# Patient Record
Sex: Male | Born: 1985 | Race: White | Hispanic: No | Marital: Married | Smoking: Former smoker
Health system: Southern US, Community
[De-identification: ages and names within clinical notes are randomized; demographics above are authoritative.]

## PROBLEM LIST (undated history)

## (undated) DIAGNOSIS — F419 Anxiety disorder, unspecified: Secondary | ICD-10-CM

---
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
## 2008-04-09 ENCOUNTER — Emergency Department: Payer: Self-pay | Admitting: Emergency Medicine

## 2010-01-22 ENCOUNTER — Emergency Department (HOSPITAL_COMMUNITY)
Admission: EM | Admit: 2010-01-22 | Discharge: 2010-01-22 | Payer: Self-pay | Source: Home / Self Care | Admitting: Emergency Medicine

## 2017-06-10 DIAGNOSIS — M5412 Radiculopathy, cervical region: Secondary | ICD-10-CM | POA: Diagnosis not present

## 2017-06-10 DIAGNOSIS — M5416 Radiculopathy, lumbar region: Secondary | ICD-10-CM | POA: Diagnosis not present

## 2017-06-15 ENCOUNTER — Other Ambulatory Visit: Payer: Self-pay

## 2017-06-15 ENCOUNTER — Encounter (HOSPITAL_COMMUNITY): Payer: Self-pay

## 2017-06-15 ENCOUNTER — Emergency Department (HOSPITAL_COMMUNITY)
Admission: EM | Admit: 2017-06-15 | Discharge: 2017-06-15 | Disposition: A | Discharge status: Home / Self Care | Payer: BLUE CROSS/BLUE SHIELD | Attending: Emergency Medicine | Admitting: Emergency Medicine

## 2017-06-15 ENCOUNTER — Emergency Department (HOSPITAL_COMMUNITY): Payer: BLUE CROSS/BLUE SHIELD

## 2017-06-15 DIAGNOSIS — Z87891 Personal history of nicotine dependence: Secondary | ICD-10-CM | POA: Diagnosis not present

## 2017-06-15 DIAGNOSIS — Z79899 Other long term (current) drug therapy: Secondary | ICD-10-CM | POA: Diagnosis not present

## 2017-06-15 DIAGNOSIS — J36 Peritonsillar abscess: Secondary | ICD-10-CM | POA: Diagnosis not present

## 2017-06-15 DIAGNOSIS — J029 Acute pharyngitis, unspecified: Secondary | ICD-10-CM | POA: Diagnosis not present

## 2017-06-15 DIAGNOSIS — R221 Localized swelling, mass and lump, neck: Secondary | ICD-10-CM | POA: Diagnosis not present

## 2017-06-15 DIAGNOSIS — J039 Acute tonsillitis, unspecified: Secondary | ICD-10-CM | POA: Diagnosis not present

## 2017-06-15 HISTORY — DX: Anxiety disorder, unspecified: F41.9

## 2017-06-15 LAB — CBC
HCT: 39.3 % (ref 39.0–52.0)
HEMOGLOBIN: 13.6 g/dL (ref 13.0–17.0)
MCH: 31.1 pg (ref 26.0–34.0)
MCHC: 34.6 g/dL (ref 30.0–36.0)
MCV: 89.9 fL (ref 78.0–100.0)
Platelets: 173 10*3/uL (ref 150–400)
RBC: 4.37 MIL/uL (ref 4.22–5.81)
RDW: 12.7 % (ref 11.5–15.5)
WBC: 6.8 10*3/uL (ref 4.0–10.5)

## 2017-06-15 LAB — BASIC METABOLIC PANEL
Anion gap: 9 (ref 5–15)
BUN: 9 mg/dL (ref 6–20)
CALCIUM: 9.2 mg/dL (ref 8.9–10.3)
CHLORIDE: 104 mmol/L (ref 101–111)
CO2: 27 mmol/L (ref 22–32)
CREATININE: 0.81 mg/dL (ref 0.61–1.24)
GFR calc Af Amer: >60 mL/min (ref >60)
GFR calc non Af Amer: >60 mL/min (ref >60)
Glucose, Bld: 100 mg/dL — ABNORMAL HIGH (ref 65–99)
Potassium: 4 mmol/L (ref 3.5–5.1)
SODIUM: 140 mmol/L (ref 135–145)

## 2017-06-15 MED ORDER — IOPAMIDOL (ISOVUE-300) INJECTION 61%
INTRAVENOUS | Status: AC
  Filled 2017-06-15: qty 100

## 2017-06-15 MED ORDER — DEXAMETHASONE SODIUM PHOSPHATE 10 MG/ML IJ SOLN
20.0000 mg | Freq: Once | INTRAMUSCULAR | Status: AC
  Administered 2017-06-15: 20 mg via INTRAVENOUS
  Filled 2017-06-15: qty 2

## 2017-06-15 MED ORDER — IOPAMIDOL (ISOVUE-300) INJECTION 61%
80.0000 mL | Freq: Once | INTRAVENOUS | Status: AC | PRN
  Administered 2017-06-15: 75 mL via INTRAVENOUS

## 2017-06-15 MED ORDER — AMOXICILLIN 500 MG PO CAPS: 500.0000 mg | ORAL_CAPSULE | Freq: Two times a day (BID) | ORAL | 0 refills | Status: AC

## 2017-06-15 MED ORDER — CLINDAMYCIN PHOSPHATE 600 MG/50ML IV SOLN
600.0000 mg | Freq: Once | INTRAVENOUS | Status: AC
  Administered 2017-06-15: 600 mg via INTRAVENOUS
  Filled 2017-06-15: qty 50

## 2017-06-15 NOTE — ED Triage Notes (Addendum)
Pt arrives today c/o right neck pain x1 week. Pt was seen at urgent care this morning--strep test negative, but was dx with peritonsillar abscess and instructed to be seen at the emergency department. Pt reports pain with swallowing, but is able to swallow. Swelling noted in right side lymph node area

## 2017-06-15 NOTE — Discharge Instructions (Addendum)
Take antibiotics as prescribed.  Take the entire course, even if your symptoms improve. Continue to use Tylenol or ibuprofen as needed for pain or swelling. Follow-up with the ear nose and throat doctor if your symptoms are worsening. Return to the emergency room if you develop difficulty breathing, inability to swallow your spit, or any new or concerning symptoms.

## 2017-06-15 NOTE — ED Provider Notes (Signed)
[facility]-EMERGENCY DEPT Provider Note   CSN: [id number] Arrival date & time: 06/15/17  1138     History   Chief Complaint Chief Complaint  Patient presents with  . Abscess    HPI Brian Archer is a 32 y.o. male presenting for evaluation of sore throat.  Patient states that he developed a sore throat on Monday or Tuesday.  Pain is on the right side.  It has progressively gotten more painful and difficult to swallow.  He was evaluated at urgent care, told to come to the ER for concerns for peritonsillar abscess.  He had a negative strep test performed at the urgent care.  He denies fevers, chills, ear pain, nasal congestion, chest pain, cough, shortness of breath, nausea, vomiting, abdominal pain.  He denies medical problems, takes fluoxetine daily.  No other medications.  He has been taking ibuprofen with mild improvement of pain and swelling.  He is handling secretions easily.  He reports his voice sounds more deep/muffled.  No trismus.  Girlfriend has sore throat, no one else sick.  HPI  Past Medical History:  Diagnosis Date  . Anxiety     There are no active problems to display for this patient.   History reviewed. No pertinent surgical history.      Home Medications    Prior to Admission medications   Medication Sig Start Date End Date Taking? Authorizing Provider  FLUoxetine (PROZAC) 40 MG capsule Take 40 mg by mouth daily. 06/09/17  Yes [provider]  ibuprofen (ADVIL,MOTRIN) 200 MG tablet Take 800 mg by mouth every 8 (eight) hours as needed for mild pain.   Yes [provider]  amoxicillin (AMOXIL) 500 MG capsule Take 1 capsule (500 mg total) by mouth 2 (two) times daily for 10 days. 06/15/17 06/25/17  Abrahim Sargent, PA-C    Family History History reviewed. No pertinent family history.  Social History Social History   Tobacco Use  . Smoking status: Former Games developermoker  . Smokeless tobacco: Never Used  Substance Use  Topics  . Alcohol use: Never    Frequency: Never  . Drug use: Never     Allergies   Patient has no known allergies.   Review of Systems Review of Systems  Constitutional: Negative for fever.  HENT: Positive for sore throat, trouble swallowing and voice change. Negative for ear pain and rhinorrhea.   Respiratory: Negative for cough and shortness of breath.   Cardiovascular: Negative for chest pain.  Gastrointestinal: Negative for nausea and vomiting.  All other systems reviewed and are negative.    Physical Exam Updated Vital Signs BP 120/75 (BP Location: Right Arm)   Pulse (!) 58   Temp 98 F (36.7 C) (Oral)   Resp 18   Ht 6\' 2"  (1.88 m)   Wt 94.8 kg (209 lb)   SpO2 98%   BMI 26.83 kg/m   Physical Exam  Constitutional: He is oriented to person, place, and time. He appears well-developed and well-nourished. No distress.  In no acute distress  HENT:  Head: Normocephalic and atraumatic.  Right Ear: Tympanic membrane, external ear and ear canal normal.  Left Ear: Tympanic membrane, external ear and ear canal normal.  Nose: Nose normal. Right sinus exhibits no maxillary sinus tenderness and no frontal sinus tenderness. Left sinus exhibits no maxillary sinus tenderness and no frontal sinus tenderness.  Mouth/Throat: Uvula is midline and mucous membranes are normal. No trismus in the jaw. Posterior oropharyngeal erythema and tonsillar abscesses present.  Tonsils are 3+ on the right. Tonsillar exudate.  Right tonsil swollen, erythematous, and with exudate.  Pushing uvula to the left.  No trismus.  Handling secretions easily.  Muffled voice.  Eyes: Pupils are equal, round, and reactive to light. Conjunctivae and EOM are normal.  Neck: Normal range of motion.  Cardiovascular: Normal rate, regular rhythm and intact distal pulses.  Pulmonary/Chest: Effort normal and breath sounds normal. He has no decreased breath sounds. He has no wheezes. He has no rhonchi. He has no rales.  Pt  speaking in full sentences without difficulty. Clear lung sounds in all fields  Abdominal: Soft. He exhibits no distension. There is no tenderness.  Musculoskeletal: Normal range of motion.  Lymphadenopathy:    He has no cervical adenopathy.  Neurological: He is alert and oriented to person, place, and time.  Skin: Skin is warm.  Psychiatric: He has a normal mood and affect.  Nursing note and vitals reviewed.    ED Treatments / Results  Labs (all labs ordered are listed, but only abnormal results are displayed) Labs Reviewed  BASIC METABOLIC PANEL - Abnormal; Notable for the following components:      Result Value   Glucose, Bld 100 (*)    All other components within normal limits  CBC    EKG None  Radiology Ct Soft Tissue Neck W Contrast  Result Date: 06/15/2017 CLINICAL DATA:  Right-sided neck pain for 1 week.  Stridor. EXAM: CT NECK WITH CONTRAST TECHNIQUE: Multidetector CT imaging of the neck was performed using the standard protocol following the bolus administration of intravenous contrast. CONTRAST:  75mL ISOVUE-300 IOPAMIDOL (ISOVUE-300) INJECTION 61% COMPARISON:  Cervical spine radiographs 01/22/2010 FINDINGS: Pharynx and larynx: The right palatine tonsil is enlarged and heterogeneous. A discrete abscess is not present. There is diffuse stranding in the adjacent parapharyngeal fat. No focal mass lesion is present. The airway is patent. There is mild prominence of the left palatine tonsil and lingual tonsils. No focal mass lesion is present. The hypopharynx is within normal limits. Epiglottis is normal. Vocal cords are midline and symmetric. The trachea is within normal limits. Salivary glands: The parotid and submandibular glands are normal bilaterally. Thyroid: Choose 1 Lymph nodes: Prominent right bilateral enlarged level 2 lymph nodes are than left more. There stranding about the nodes on the right compatible with acute inflammation. Vascular: Negative Limited intracranial:  Within normal limits. Visualized orbits: Visualized globes and orbits are unremarkable. Mastoids and visualized paranasal sinuses: A polyp or mucous retention cyst is noted anteriorly in the right sphenoid sinus. The paranasal sinuses and mastoid air cells are otherwise clear. Skeleton: Vertebral body heights and alignment are maintained. No focal lytic or blastic lesions are present. Upper chest: The lung apices are clear.  Thoracic inlet is normal. IMPRESSION: 1. Enlarged heterogeneously enhancing right palatine tonsil compatible with acute tonsillitis/pharyngitis. 2. No discrete drainable abscess. 3. Reactive level 2 adenopathy, right greater than left. Electronically Signed   By: Marin Roberts M.D.   On: 06/15/2017 15:17    Procedures Procedures (including critical care time)  Medications Ordered in ED Medications  iopamidol (ISOVUE-300) 61 % injection (has no administration in time range)  dexamethasone (DECADRON) injection 20 mg (20 mg Intravenous Given 06/15/17 1428)  clindamycin (CLEOCIN) IVPB 600 mg (0 mg Intravenous Stopped 06/15/17 1520)  iopamidol (ISOVUE-300) 61 % injection 80 mL (75 mLs Intravenous Contrast Given 06/15/17 1443)     Initial Impression / Assessment and Plan / ED Course  I have reviewed the triage  vital signs and the nursing notes.  Pertinent labs & imaging results that were available during my care of the patient were reviewed by me and considered in my medical decision making (see chart for details).     Patient presenting for evaluation of sore throat.  Physical exam concerning for peritonsillar abscess.  Handling secretions without difficulty, no airway compromise at this time.  Had negative strep test at urgent care.  Will obtain basic labs, ct neck, and give Decadron and antibiotics.  Labs reassuring, no leukocytosis.  CT neck shows acute tonsillitis/pharyngitis without deep space infection.  Patient reports mild improvement with antibiotics and Decadron.   Will discharge with Amoxil and have patient follow-up with ENT as needed.  At this time, patient is a for discharge.  Strict return precautions given.  Patient states he understands and agrees to plan.  Final Clinical Impressions(s) / ED Diagnoses   Final diagnoses:  Tonsillitis    ED Discharge Orders        Ordered    amoxicillin (AMOXIL) 500 MG capsule  2 times daily     06/15/17 [address] PA-C 06/15/17 1605    Azalia Bilis, MD 06/16/17 2258

## 2017-07-12 DIAGNOSIS — N342 Other urethritis: Secondary | ICD-10-CM | POA: Diagnosis not present

## 2018-05-19 DIAGNOSIS — D485 Neoplasm of uncertain behavior of skin: Secondary | ICD-10-CM | POA: Diagnosis not present

## 2018-05-19 DIAGNOSIS — D2261 Melanocytic nevi of right upper limb, including shoulder: Secondary | ICD-10-CM | POA: Diagnosis not present

## 2018-05-20 DIAGNOSIS — Z13220 Encounter for screening for lipoid disorders: Secondary | ICD-10-CM | POA: Diagnosis not present

## 2018-05-20 DIAGNOSIS — Z0000 Encounter for general adult medical examination without abnormal findings: Secondary | ICD-10-CM | POA: Diagnosis not present

## 2018-10-25 ENCOUNTER — Emergency Department (HOSPITAL_COMMUNITY)
Admission: EM | Admit: 2018-10-25 | Discharge: 2018-10-25 | Disposition: A | Discharge status: Home / Self Care | Payer: BC Managed Care – PPO | Attending: Emergency Medicine | Admitting: Emergency Medicine

## 2018-10-25 ENCOUNTER — Other Ambulatory Visit: Payer: Self-pay

## 2018-10-25 ENCOUNTER — Encounter (HOSPITAL_COMMUNITY): Payer: Self-pay | Admitting: Emergency Medicine

## 2018-10-25 DIAGNOSIS — Z79899 Other long term (current) drug therapy: Secondary | ICD-10-CM | POA: Diagnosis not present

## 2018-10-25 DIAGNOSIS — Z87891 Personal history of nicotine dependence: Secondary | ICD-10-CM | POA: Insufficient documentation

## 2018-10-25 DIAGNOSIS — L0201 Cutaneous abscess of face: Secondary | ICD-10-CM | POA: Diagnosis not present

## 2018-10-25 DIAGNOSIS — Z23 Encounter for immunization: Secondary | ICD-10-CM | POA: Insufficient documentation

## 2018-10-25 MED ORDER — TETANUS-DIPHTH-ACELL PERTUSSIS 5-2.5-18.5 LF-MCG/0.5 IM SUSP
0.5000 mL | Freq: Once | INTRAMUSCULAR | Status: AC
  Administered 2018-10-25: 09:00:00 0.5 mL via INTRAMUSCULAR
  Filled 2018-10-25: qty 0.5

## 2018-10-25 MED ORDER — DOXYCYCLINE HYCLATE 100 MG PO CAPS: 100.0000 mg | ORAL_CAPSULE | Freq: Two times a day (BID) | ORAL | 0 refills | Status: DC

## 2018-10-25 MED ORDER — LIDOCAINE-EPINEPHRINE 2 %-1:100000 IJ SOLN
10.0000 mL | Freq: Once | INTRAMUSCULAR | Status: AC
  Administered 2018-10-25: 10 mL via INTRADERMAL
  Filled 2018-10-25: qty 1

## 2018-10-25 MED ORDER — IBUPROFEN 600 MG PO TABS: 600.0000 mg | ORAL_TABLET | Freq: Four times a day (QID) | ORAL | 0 refills | Status: DC | PRN

## 2018-10-25 NOTE — ED Triage Notes (Signed)
Patient here from home with complaints of abscess to left side of face under eye since Wednesday. Denies fever.

## 2018-10-25 NOTE — ED Provider Notes (Signed)
[name] Provider Note   CSN: [id number] Arrival date & time: 10/25/18  4098     History   Chief Complaint Chief Complaint  Patient presents with  . Abscess    HPI Brian Archer is a 33 y.o. male.     The history is provided by the patient. No language interpreter was used.  Abscess Associated symptoms: no fever and no headaches      33 year old male presents for evaluation of facial abscess.  Patient report for the past 4 days he noticed gradual onset of a bump to the left side of his face that has become increasingly more painful and increased in size.  Described pain as a throbbing sensation, moderate in severity, nonradiating.  No pain with eye movement, no fever chills and denies any injury.  He reported having abscess in his armpits in the past but none in the face.  He has been using warm compress without adequate relief.  He is unable to recall last tetanus status.  Past Medical History:  Diagnosis Date  . Anxiety     There are no active problems to display for this patient.   History reviewed. No pertinent surgical history.      Home Medications    Prior to Admission medications   Medication Sig Start Date End Date Taking? Authorizing Provider  FLUoxetine (PROZAC) 40 MG capsule Take 40 mg by mouth daily. 06/09/17   [provider]  ibuprofen (ADVIL,MOTRIN) 200 MG tablet Take 800 mg by mouth every 8 (eight) hours as needed for mild pain.    [provider]    Family History No family history on file.  Social History Social History   Tobacco Use  . Smoking status: Former Research scientist (life sciences)  . Smokeless tobacco: Never Used  Substance Use Topics  . Alcohol use: Never    Frequency: Never  . Drug use: Never     Allergies   Patient has no known allergies.   Review of Systems Review of Systems  Constitutional: Negative for fever.  Skin: Positive for rash.  Neurological: Negative for headaches.      Physical Exam Updated Vital Signs BP (!) 155/79 (BP Location: Right Arm)   Pulse 71   Temp 98.1 F (36.7 C) (Oral)   Resp 19   SpO2 100%   Physical Exam Vitals signs and nursing note reviewed.  Constitutional:      General: He is not in acute distress.    Appearance: He is well-developed.  HENT:     Head: Atraumatic.  Eyes:     Extraocular Movements: Extraocular movements intact.     Conjunctiva/sclera: Conjunctivae normal.     Pupils: Pupils are equal, round, and reactive to light.  Neck:     Musculoskeletal: Neck supple.  Skin:    Findings: No rash.     Comments: Face: An area of induration with small amount of fluctuant approximately 1 cm in diameter with surrounding skin erythema and edema noted to left zygomatic arch without any orbital involvement.  Area is tender to palpation.  Neurological:     Mental Status: He is alert.      ED Treatments / Results  Labs (all labs ordered are listed, but only abnormal results are displayed) Labs Reviewed - No data to display  EKG None  Radiology No results found.  Procedures .Marland KitchenIncision and Drainage  Date/Time: 10/25/2018 9:42 AM Performed by: Domenic Moras, PA-C Authorized by: Domenic Moras, PA-C   Consent:  Consent obtained:  Verbal   Consent given by:  Patient   Risks discussed:  Bleeding, incomplete drainage, pain and damage to other organs   Alternatives discussed:  No treatment Universal protocol:    Procedure explained and questions answered to patient or proxy's satisfaction: yes     Relevant documents present and verified: yes     Test results available and properly labeled: yes     Imaging studies available: yes     Required blood products, implants, devices, and special equipment available: yes     Site/side marked: yes     Immediately prior to procedure a time out was called: yes     Patient identity confirmed:  Verbally with patient Location:    Type:  Abscess   Size:  1cm   Location:  Head    Head location:  Face Pre-procedure details:    Skin preparation:  Betadine Anesthesia (see MAR for exact dosages):    Anesthesia method:  Local infiltration   Local anesthetic:  Lidocaine 1% WITH epi Procedure type:    Complexity:  Simple Procedure details:    Incision types:  Single straight   Incision depth:  Subcutaneous   Scalpel blade:  11   Wound management:  Probed and deloculated, irrigated with saline and extensive cleaning   Drainage:  Purulent   Drainage amount:  Scant   Packing materials:  None Post-procedure details:    Patient tolerance of procedure:  Tolerated well, no immediate complications   (including critical care time)  Medications Ordered in ED Medications  Tdap (BOOSTRIX) injection 0.5 mL (0.5 mLs Intramuscular Given 10/25/18 0919)  lidocaine-EPINEPHrine (XYLOCAINE W/EPI) 2 %-1:100000 (with pres) injection 10 mL (10 mLs Intradermal Given 10/25/18 0920)     Initial Impression / Assessment and Plan / ED Course  I have reviewed the triage vital signs and the nursing notes.  Pertinent labs & imaging results that were available during my care of the patient were reviewed by me and considered in my medical decision making (see chart for details).        BP (!) 155/79 (BP Location: Right Arm)   Pulse 71   Temp 98.1 F (36.7 C) (Oral)   Resp 19   SpO2 100%    Final Clinical Impressions(s) / ED Diagnoses   Final diagnoses:  Cutaneous abscess of face    ED Discharge Orders         Ordered    doxycycline (VIBRAMYCIN) 100 MG capsule  2 times daily     10/25/18 0945    ibuprofen (ADVIL) 600 MG tablet  Every 6 hours PRN     10/25/18 0945         9:07 AM Patient here with a cutaneous abscess noted to the left side of his face near his zygomatic arch.  Will perform incision and drainage.  Will update tetanus.  9:43 AM Successful I&D of facial abscess.  Patient discharged home with antibiotic and anti-inflammatory medication.  Recommend warm  compress.  Return precautions discussed.   Fayrene Helper, PA-C 10/25/18 3295    Linwood Dibbles, MD 10/26/18 1003

## 2019-02-19 DIAGNOSIS — E785 Hyperlipidemia, unspecified: Secondary | ICD-10-CM | POA: Diagnosis not present

## 2019-02-20 ENCOUNTER — Emergency Department (HOSPITAL_COMMUNITY): Payer: BC Managed Care – PPO

## 2019-02-20 ENCOUNTER — Emergency Department (HOSPITAL_COMMUNITY)
Admission: EM | Admit: 2019-02-20 | Discharge: 2019-02-20 | Disposition: A | Discharge status: Home / Self Care | Payer: BC Managed Care – PPO | Attending: Emergency Medicine | Admitting: Emergency Medicine

## 2019-02-20 ENCOUNTER — Encounter (HOSPITAL_COMMUNITY): Payer: Self-pay

## 2019-02-20 ENCOUNTER — Other Ambulatory Visit: Payer: Self-pay

## 2019-02-20 DIAGNOSIS — Z79899 Other long term (current) drug therapy: Secondary | ICD-10-CM | POA: Diagnosis not present

## 2019-02-20 DIAGNOSIS — R0789 Other chest pain: Secondary | ICD-10-CM | POA: Diagnosis not present

## 2019-02-20 DIAGNOSIS — J189 Pneumonia, unspecified organism: Secondary | ICD-10-CM | POA: Insufficient documentation

## 2019-02-20 DIAGNOSIS — R109 Unspecified abdominal pain: Secondary | ICD-10-CM | POA: Diagnosis not present

## 2019-02-20 DIAGNOSIS — R079 Chest pain, unspecified: Secondary | ICD-10-CM | POA: Diagnosis not present

## 2019-02-20 DIAGNOSIS — M546 Pain in thoracic spine: Secondary | ICD-10-CM | POA: Insufficient documentation

## 2019-02-20 LAB — BASIC METABOLIC PANEL
Anion gap: 13 (ref 5–15)
BUN: 8 mg/dL (ref 6–20)
CO2: 25 mmol/L (ref 22–32)
Calcium: 9.6 mg/dL (ref 8.9–10.3)
Chloride: 101 mmol/L (ref 98–111)
Creatinine, Ser: 0.75 mg/dL (ref 0.61–1.24)
GFR calc Af Amer: >60 mL/min (ref >60)
GFR calc non Af Amer: >60 mL/min (ref >60)
Glucose, Bld: 131 mg/dL — ABNORMAL HIGH (ref 70–99)
Potassium: 3.7 mmol/L (ref 3.5–5.1)
Sodium: 139 mmol/L (ref 135–145)

## 2019-02-20 LAB — CBC
HCT: 46.1 % (ref 39.0–52.0)
Hemoglobin: 16.2 g/dL (ref 13.0–17.0)
MCH: 31.6 pg (ref 26.0–34.0)
MCHC: 35.1 g/dL (ref 30.0–36.0)
MCV: 90 fL (ref 80.0–100.0)
Platelets: 253 10*3/uL (ref 150–400)
RBC: 5.12 MIL/uL (ref 4.22–5.81)
RDW: 12.3 % (ref 11.5–15.5)
WBC: 5.5 10*3/uL (ref 4.0–10.5)
nRBC: 0 % (ref 0.0–0.2)

## 2019-02-20 LAB — TROPONIN I (HIGH SENSITIVITY)
Troponin I (High Sensitivity): 3 ng/L (ref <18)
Troponin I (High Sensitivity): 4 ng/L (ref <18)

## 2019-02-20 MED ORDER — IOHEXOL 350 MG/ML SOLN
100.0000 mL | Freq: Once | INTRAVENOUS | Status: AC | PRN
  Administered 2019-02-20: 100 mL via INTRAVENOUS

## 2019-02-20 MED ORDER — AZITHROMYCIN 250 MG PO TABS: 250.0000 mg | ORAL_TABLET | Freq: Every day | ORAL | 0 refills | Status: DC

## 2019-02-20 MED ORDER — SODIUM CHLORIDE 0.9% FLUSH: 3.0000 mL | Freq: Once | INTRAVENOUS | Status: DC

## 2019-02-20 MED ORDER — SODIUM CHLORIDE (PF) 0.9 % IJ SOLN
INTRAMUSCULAR | Status: AC
  Filled 2019-02-20: qty 50

## 2019-02-20 NOTE — ED Notes (Addendum)
Patient given discharge teaching and verbalized understanding. Patient ambulated out of ED with a steady gait. Patient VS not updated with 1 hour range. Patient wanted to be discharged.

## 2019-02-20 NOTE — ED Provider Notes (Signed)
[facility] DEPT Provider Note   CSN: [id number] Arrival date & time: 02/20/19  2683     History Chief Complaint  Patient presents with  . Back Pain    Brian Archer is a 34 y.o. male.  HPI 34 year old male presents with back pain and arm numbness.  He states that he has had the back pain pretty much constantly for the last couple days.  He rates it as a moderate, 5/10. It is in between his shoulder blades, may be a little bit more on the right.  Sometimes certain movements like bending make it a little worse.  Occasionally some chest tightness.  Feels like the pain radiates from his back to his chest.  Today at around 7:30 AM while he was walking his dog he felt both of his arms go numb like they were asleep.  He can move them but it felt heavier.  Lasted only a few minutes.  Currently they are much better.  No leg symptoms.  No abdominal pain.  Pain in his back is moderate.  Went to his doctor yesterday where he was told his blood pressure was a little high but otherwise he does not have hypertension.  He does have elevated cholesterol and has been trying diet changes.  Previous smoker but has quit for 3 years.  No family history of coronary disease.   Past Medical History:  Diagnosis Date  . Anxiety     There are no problems to display for this patient.   History reviewed. No pertinent surgical history.     Family History  Problem Relation Age of Onset  . Healthy Mother   . Healthy Father     Social History   Tobacco Use  . Smoking status: Former Research scientist (life sciences)  . Smokeless tobacco: Never Used  Substance Use Topics  . Alcohol use: Never  . Drug use: Never    Home Medications Prior to Admission medications   Medication Sig Start Date End Date Taking? Authorizing Provider  acetaminophen (TYLENOL) 325 MG tablet Take 650 mg by mouth every 6 (six) hours as needed for mild pain or headache.   Yes [provider]  FLUoxetine (PROZAC)  40 MG capsule Take 40 mg by mouth daily. 06/09/17  Yes [provider]  azithromycin (ZITHROMAX) 250 MG tablet Take 1 tablet (250 mg total) by mouth daily. Take first 2 tablets together, then 1 every day until finished. 02/20/19   Sherwood Gambler, MD    Allergies    Patient has no known allergies.  Review of Systems   Review of Systems  Constitutional: Negative for fever.  Respiratory: Negative for shortness of breath.   Cardiovascular: Positive for chest pain.  Gastrointestinal: Negative for abdominal pain.  Musculoskeletal: Positive for back pain.  Neurological: Positive for numbness.  All other systems reviewed and are negative.   Physical Exam Updated Vital Signs BP (!) 173/87   Pulse 66   Temp 98.2 F (36.8 C) (Oral)   Resp 16   Ht 6\' 2"  (1.88 m)   Wt 94.3 kg   SpO2 96%   BMI 26.71 kg/m   Physical Exam Vitals and nursing note reviewed.  Constitutional:      Appearance: He is well-developed.  HENT:     Head: Normocephalic and atraumatic.     Right Ear: External ear normal.     Left Ear: External ear normal.     Nose: Nose normal.  Eyes:     General:  Right eye: No discharge.        Left eye: No discharge.  Cardiovascular:     Rate and Rhythm: Normal rate and regular rhythm.     Pulses:          Radial pulses are 2+ on the right side and 2+ on the left side.     Heart sounds: Normal heart sounds.  Pulmonary:     Effort: Pulmonary effort is normal.     Breath sounds: Normal breath sounds.  Chest:     Chest wall: No tenderness.  Abdominal:     Palpations: Abdomen is soft.     Tenderness: There is no abdominal tenderness.  Musculoskeletal:     Cervical back: Neck supple. No tenderness.     Thoracic back: No tenderness.  Skin:    General: Skin is warm and dry.  Neurological:     Mental Status: He is alert.     Comments: 5/5 strength in all 4 extremities. Normal gross sensation.  Psychiatric:        Mood and Affect: Mood is not anxious.      ED Results / Procedures / Treatments   Labs (all labs ordered are listed, but only abnormal results are displayed) Labs Reviewed  BASIC METABOLIC PANEL - Abnormal; Notable for the following components:      Result Value   Glucose, Bld 131 (*)    All other components within normal limits  CBC  TROPONIN I (HIGH SENSITIVITY)  TROPONIN I (HIGH SENSITIVITY)    EKG EKG Interpretation  Date/Time:  Friday February 20 2019 08:51:07 EST Ventricular Rate:  70 PR Interval:    QRS Duration: 111 QT Interval:  403 QTC Calculation: 435 R Axis:   110 Text Interpretation: Sinus rhythm Borderline T abnormalities, inferior leads ST elevation likely early repol Confirmed by Pricilla Loveless [phone]) on 02/20/2019 9:03:37 AM   Radiology DG Chest 2 View  Result Date: 02/20/2019 CLINICAL DATA:  Chest/upper back pain EXAM: CHEST - 2 VIEW COMPARISON:  None. FINDINGS: Lungs are clear. Calcified granuloma at the medial right lung base. No pleural effusion or pneumothorax. The heart is normal in size. Visualized osseous structures are within normal limits. IMPRESSION: Normal chest radiographs. Electronically Signed   By: Charline Bills M.D.   On: 02/20/2019 09:28   CT Angio Chest/Abd/Pel for Dissection W and/or Wo Contrast  Result Date: 02/20/2019 CLINICAL DATA:  Chest and abdominal pain with radiation to back EXAM: CT ANGIOGRAPHY CHEST, ABDOMEN AND PELVIS TECHNIQUE: Initially, axial CT images were obtained through the chest without intravenous contrast material administration. Multidetector CT imaging through the chest, abdomen and pelvis was performed using the standard protocol during bolus administration of intravenous contrast. Multiplanar reconstructed images and MIPs were obtained and reviewed to evaluate the vascular anatomy. CONTRAST:  OMNIPAQUE IOHEXOL 350 MG/ML SOLN COMPARISON:  None. FINDINGS: CTA CHEST FINDINGS Cardiovascular: No intramural hematoma is evident on noncontrast enhanced  study. There is no thoracic aortic aneurysm or dissection. There is no evident mediastinal hematoma. Visualized great vessels appear normal. There is no appreciable atherosclerotic plaque noted in the arterial vessels in the thoracic region. There is no pericardial effusion or pericardial thickening. There is no demonstrable pulmonary embolus. Mediastinum/Nodes: Visualized thyroid appears normal. There is no thoracic adenopathy. No esophageal lesions are evident. Lungs/Pleura: No evident pneumothorax. There is a small area of ill-defined ground-glass type opacity in the superior segment of the left lower lobe. The lungs elsewhere are clear. No pleural effusions are evident.  Musculoskeletal: No fracture or dislocation. No blastic or lytic bone lesions. No evident chest wall lesion. Review of the MIP images confirms the above findings. CTA ABDOMEN AND PELVIS FINDINGS VASCULAR Aorta: There is no abdominal aortic aneurysm or dissection. No appreciable atherosclerotic plaque noted in the aorta. There is slight aortic tortuosity in the mid aorta. Celiac: Celiac artery and its branches are widely patent. No aneurysm or dissection. SMA: Superior mesenteric artery and its branches are widely patent. No aneurysm or dissection evident. Renals: There is a single renal artery on each side. Right renal artery branches in its midportion. Each renal artery its branches is widely patent. No appreciable atherosclerotic plaque. No aneurysm or dissection. No evident fibromuscular dysplasia. IMA: Inferior mesenteric artery its branches are widely patent. No aneurysm or dissection involving these vessels. Inflow: Pelvic arterial vessels appear widely patent throughout their respective courses. No aneurysm or dissection involving these vessels. The proximal profunda femoral and superficial femoral arteries are widely patent without aneurysm or dissection. Veins: No obvious venous abnormality within the limitations of this arterial phase  study. Review of the MIP images confirms the above findings. NON-VASCULAR Hepatobiliary: There is a degree of hepatic steatosis. No focal liver lesions are evident. Gallbladder wall is not appreciably thickened. There is no biliary duct dilatation. Pancreas: There is no pancreatic mass or inflammatory focus. Spleen: No splenic lesions are evident. Adrenals/Urinary Tract: Adrenals bilaterally appear normal. Kidneys bilaterally show no evident mass or hydronephrosis on either side. There is no evident renal or ureteral calculus on either side. Urinary bladder is midline with wall thickness within normal limits. Stomach/Bowel: There is no appreciable bowel wall or mesenteric thickening. There is fairly diffuse stool throughout the colon. There is no appreciable bowel obstruction. Terminal ileum appears unremarkable. There is no evident free air or portal venous air. Lymphatic: There is no evident adenopathy in the abdomen or pelvis. Reproductive: Prostate and seminal vesicles are normal in size and contour. No pelvic mass evident. Other: Appendix appears unremarkable. No evident abscess or ascites in the abdomen or pelvis. Musculoskeletal: No evident fracture or dislocation. No blastic or lytic bone lesions. No intramuscular or abdominal wall lesions evident. Review of the MIP images confirms the above findings. IMPRESSION: CT angiogram chest: 1. Focal area of apparent pneumonia in the superior segment left lower lobe. Suspect focus of atypical organism pneumonia. Lungs otherwise clear. No pleural effusions. No pneumothorax. 2. Thoracic aortic aneurysm or dissection. No vascular lesions evident. 3.  No demonstrable pulmonary embolus. 4.  No evident adenopathy. CT angiogram abdomen; CT angiogram pelvis: 1. No aneurysm or dissection involving the aorta, major pelvic, and major mesenteric arterial vessels. No appreciable atherosclerotic plaque noted in these vessels. No fibromuscular dysplasia. 2.  Hepatic steatosis. 3.  Fairly diffuse stool throughout colon. Question a degree of constipation. No bowel obstruction. 4.  No abscess in the abdomen or pelvis.  Appendix appears normal. 5. No evident renal or ureteral calculus. No hydronephrosis. Urinary bladder wall thickness normal. Electronically Signed   By: Bretta Bang III M.D.   On: 02/20/2019 10:47    Procedures Procedures (including critical care time)  Medications Ordered in ED Medications  sodium chloride flush (NS) 0.9 % injection 3 mL (3 mLs Intravenous Not Given 02/20/19 0903)  sodium chloride (PF) 0.9 % injection (has no administration in time range)  iohexol (OMNIPAQUE) 350 MG/ML injection 100 mL (100 mLs Intravenous Contrast Given 02/20/19 1011)    ED Course  I have reviewed the triage vital signs and the  nursing notes.  Pertinent labs & imaging results that were available during my care of the patient were reviewed by me and considered in my medical decision making (see chart for details).    MDM Rules/Calculators/A&P HEAR Score: 3                    Given the bilateral arm symptoms, stroke is highly unlikely.  Given the primary back pain, work-up for dissection obtained and is negative.  He has had some hypertension here though he is also anxious.  Troponins are negative x2.  ECGs are nonspecific but no obvious acute ischemia.  With heart score of 3, negative troponins, I do not think he needs to be admitted but does need to follow-up with his PCP.  CT showed possible atypical pneumonia.  This does appear more posterior so could be causing some of his back pain and will treat with azithromycin.  Return precautions. Final Clinical Impression(s) / ED Diagnoses Final diagnoses:  Acute midline thoracic back pain  Atypical pneumonia    Rx / DC Orders ED Discharge Orders         Ordered    azithromycin (ZITHROMAX) 250 MG tablet  Daily     02/20/19 1231           Pricilla Loveless, MD 02/20/19 1330

## 2019-02-20 NOTE — Discharge Instructions (Addendum)
If you develop worsening, recurrent, or continued back pain, numbness or weakness in the legs, incontinence of your bowels or bladders, numbness of your buttocks, fever, chest pain, trouble breathing, abdominal pain, or any other new/concerning symptoms then return to the ER for evaluation.

## 2019-02-20 NOTE — ED Triage Notes (Signed)
Patient states he has been having upper back pain for a few days. Patient states states he saw his PCP where he had blood work done.  Today, the patient states the pain radiated to his chest and he had bilateral arm numbness this AM. Patient states he has only tingling in his arms now.

## 2019-02-23 DIAGNOSIS — F419 Anxiety disorder, unspecified: Secondary | ICD-10-CM | POA: Diagnosis not present

## 2019-02-25 ENCOUNTER — Telehealth: Payer: Self-pay | Admitting: *Deleted

## 2019-02-25 NOTE — Telephone Encounter (Signed)
Pt seen in [facility] 02/20/2019. Pt has questions regarding CXR and CT. States he does not understand discharge summary and is concerned. Directed to call ED for further clarification of results.

## 2019-02-28 ENCOUNTER — Emergency Department (HOSPITAL_COMMUNITY)
Admission: EM | Admit: 2019-02-28 | Discharge: 2019-02-28 | Disposition: A | Discharge status: Home / Self Care | Payer: BC Managed Care – PPO | Attending: Emergency Medicine | Admitting: Emergency Medicine

## 2019-02-28 ENCOUNTER — Other Ambulatory Visit: Payer: Self-pay

## 2019-02-28 DIAGNOSIS — Z79899 Other long term (current) drug therapy: Secondary | ICD-10-CM | POA: Insufficient documentation

## 2019-02-28 DIAGNOSIS — R419 Unspecified symptoms and signs involving cognitive functions and awareness: Secondary | ICD-10-CM | POA: Diagnosis not present

## 2019-02-28 DIAGNOSIS — R5381 Other malaise: Secondary | ICD-10-CM | POA: Insufficient documentation

## 2019-02-28 DIAGNOSIS — Z8616 Personal history of COVID-19: Secondary | ICD-10-CM | POA: Insufficient documentation

## 2019-02-28 DIAGNOSIS — Z87891 Personal history of nicotine dependence: Secondary | ICD-10-CM | POA: Diagnosis not present

## 2019-02-28 NOTE — ED Triage Notes (Addendum)
Per patient, he was seen last week for chest pain, arm numbness. Patient states he was told he pneumonia in his lungs but the MD wasn't very specific? Patient states when he got home he looked at his my chart and there was a bunch of information there that he did not understand and that was not explained to him. Patient says his anxiety is now "through the roof" and he would like an explanation. Patient denies pain at this time.

## 2019-02-28 NOTE — ED Provider Notes (Signed)
[facility]-EMERGENCY DEPT Provider Note   CSN: [id number] Arrival date & time: 02/28/19  0745     History Chief Complaint  Patient presents with  . Anxiety    BRENNEN CAMPER is a 34 y.o. male.  HPI Patient is here, to help get clarification  Of recent ED evaluation and discharge.  He was seen and evaluated on 02/20/2019 for back pain with arm numbness.  He was ultimately diagnosed with atypical pneumonia and discharged on Zithromax.  Because he had pain, as well, he was evaluated with CT angiogram of the chest abdomen pelvis, dissection protocol.  Pneumonia was evident on CT imaging, but not plain images.  Blood parameters were normal with exception of mild glucose elevation on a nonfasting screen.  Today, the patient denies headache, neck pain, back pain, fever, chills, cough, shortness of breath or dizziness.  He has no symptoms after taking the Zithromax.  He states that he had a COVID-19 infection, about a month ago, with minimal symptoms.  He continues to be employed.  He is an ex smoker and has essentially stopped drinking alcohol.  There are no other known modifying factors.  Past Medical History:  Diagnosis Date  . Anxiety     There are no problems to display for this patient.   No past surgical history on file.     Family History  Problem Relation Age of Onset  . Healthy Mother   . Healthy Father     Social History   Tobacco Use  . Smoking status: Former Games developer  . Smokeless tobacco: Never Used  Substance Use Topics  . Alcohol use: Never  . Drug use: Never    Home Medications Prior to Admission medications   Medication Sig Start Date End Date Taking? Authorizing Provider  acetaminophen (TYLENOL) 325 MG tablet Take 650 mg by mouth every 6 (six) hours as needed for mild pain or headache.    [provider]  azithromycin (ZITHROMAX) 250 MG tablet Take 1 tablet (250 mg total) by mouth daily. Take first 2 tablets together, then 1  every day until finished. 02/20/19   Pricilla Loveless, MD  FLUoxetine (PROZAC) 40 MG capsule Take 40 mg by mouth daily. 06/09/17   [provider]    Allergies    Patient has no known allergies.  Review of Systems   Review of Systems  All other systems reviewed and are negative.   Physical Exam Updated Vital Signs BP (!) 163/80 (BP Location: Left Arm)   Pulse 75   Temp 98.6 F (37 C) (Oral)   Resp 16   Ht 6\' 2"  (1.88 m)   Wt 94.3 kg   SpO2 96%   BMI 26.71 kg/m   Physical Exam Vitals and nursing note reviewed.  Constitutional:      Appearance: He is well-developed.  HENT:     Head: Normocephalic and atraumatic.     Right Ear: External ear normal.     Left Ear: External ear normal.  Eyes:     Conjunctiva/sclera: Conjunctivae normal.     Pupils: Pupils are equal, round, and reactive to light.  Neck:     Trachea: Phonation normal.  Cardiovascular:     Rate and Rhythm: Normal rate.  Pulmonary:     Effort: Pulmonary effort is normal.  Abdominal:     General: There is no distension.  Musculoskeletal:        General: Normal range of motion.     Cervical back: Normal  range of motion and neck supple.  Skin:    General: Skin is warm and dry.  Neurological:     Mental Status: He is alert and oriented to person, place, and time.     Cranial Nerves: No cranial nerve deficit.     Sensory: No sensory deficit.     Motor: No abnormal muscle tone.     Coordination: Coordination normal.  Psychiatric:        Mood and Affect: Mood normal.        Behavior: Behavior normal.        Thought Content: Thought content normal.        Judgment: Judgment normal.     ED Results / Procedures / Treatments   Labs (all labs ordered are listed, but only abnormal results are displayed) Labs Reviewed - No data to display  EKG None  Radiology No results found.  Procedures Procedures (including critical care time)  Medications Ordered in ED Medications - No data to  display  ED Course  I have reviewed the triage vital signs and the nursing notes.  Pertinent labs & imaging results that were available during my care of the patient were reviewed by me and considered in my medical decision making (see chart for details).    MDM Rules/Calculators/A&P                       Patient Vitals for the past 24 hrs:  BP Temp Temp src Pulse Resp SpO2 Height Weight  02/28/19 0755 (!) 163/80 98.6 F (37 C) Oral 75 16 96 % 6\' 2"  (1.88 m) 94.3 kg    8:51 AM Reevaluation with update and discussion. After initial assessment and treatment, an updated evaluation reveals no change in clinical status, findings discussed with the patient and all questions were answered. Daleen Bo   Medical Decision Making: Patient with resolution of symptoms after treatment with Zithromax, for atypical pneumonia.  I suspect that the CT abnormality, of the lungs, was related to his prior COVID-19 infection.  I doubt that he has a ongoing bacterial infection.  Note that he is an ex-smoker.  He is overall very good shape.  Initial blood pressure was mildly elevated.  He has no signs or symptoms of hypertensive urgency.  There is no indication for additional testing or treatment.  I had a long discussion with the patient, to explain his prior diagnosis and testing results.  He was satisfied with this discussion and had no additional complaints or concerns.  Benuel Richardo Hanks was evaluated in Emergency Department on 02/28/2019 for the symptoms described in the history of present illness. He was evaluated in the context of the global COVID-19 pandemic, which necessitated consideration that the patient might be at risk for infection with the SARS-CoV-2 virus that causes COVID-19. Institutional protocols and algorithms that pertain to the evaluation of patients at risk for COVID-19 are in a state of rapid change based on information released by regulatory bodies including the CDC and federal and state  organizations. These policies and algorithms were followed during the patient's care in the ED.   CRITICAL CARE-no Performed by: Daleen Bo   Nursing Notes Reviewed/ Care Coordinated Applicable Imaging Reviewed Interpretation of Laboratory Data incorporated into ED treatment  The patient appears reasonably screened and/or stabilized for discharge and I doubt any other medical condition or other [facility] requiring further screening, evaluation, or treatment in the ED at this time prior to discharge.  Plan: Home  Medications-continue OTC treatments as needed; Home Treatments-rest, fluids, stress management at home; return here if the recommended treatment, does not improve the symptoms; Recommended follow up-PCP, as needed    Final Clinical Impression(s) / ED Diagnoses Final diagnoses:  Malaise    Rx / DC Orders ED Discharge Orders    None       Mancel Bale, MD 02/28/19 [phone]

## 2019-02-28 NOTE — Discharge Instructions (Addendum)
We have reviewed the testing from your prior visit on 12/20/2019.  Your lack of symptoms today, and the prior testing, indicate that you are in great shape.  Make sure you are getting plenty of rest, eating right and taking time to relax.  This includes stress management.  Follow-up with your primary care provider as needed for problems or concerns.

## 2019-03-11 ENCOUNTER — Ambulatory Visit (INDEPENDENT_AMBULATORY_CARE_PROVIDER_SITE_OTHER): Payer: BC Managed Care – PPO | Admitting: Pulmonary Disease

## 2019-03-11 ENCOUNTER — Other Ambulatory Visit: Payer: Self-pay

## 2019-03-11 ENCOUNTER — Encounter: Payer: Self-pay | Admitting: Pulmonary Disease

## 2019-03-11 VITALS — BP 134/70 | HR 64 | Temp 97.5°F | Ht 75.0 in | Wt 205.0 lb

## 2019-03-11 DIAGNOSIS — J8410 Pulmonary fibrosis, unspecified: Secondary | ICD-10-CM | POA: Diagnosis not present

## 2019-03-11 DIAGNOSIS — Z8616 Personal history of COVID-19: Secondary | ICD-10-CM | POA: Diagnosis not present

## 2019-03-11 DIAGNOSIS — R918 Other nonspecific abnormal finding of lung field: Secondary | ICD-10-CM | POA: Diagnosis not present

## 2019-03-11 NOTE — Progress Notes (Signed)
Synopsis: Referred in March 2021 for self-referral abnormal CT imaging ED.  By No ref. provider found  Subjective:   PATIENT ID: Brian Archer GENDER: male DOB: 1985/07/09, MRN: [id number]  Chief Complaint  Patient presents with  . Consult    Patient is here for abnormal CT scan he had in the ED on 2/12. Patient just wants more clarification on it. Patient had Covid in Jan.     This is a 34 year old gentleman past medical history of anxiety.  Was diagnosed with COVID-19 in January 2021.  He was seen in the emergency department on 02/20/2019 with complaints of back and abdominal pain.  CT imaging of the chest revealed a focal area within the left upper lobe.  Is a very small rounded opacity along the posterior wall.  In addition upon review of the images he does have potentially mildly enlarged right ventricle as compared to the left and cavity diameter.  OV 03/11/2019: Today, patient seen with no additional complaints from a respiratory standpoint.  Here concerning for his CT imaging.  We reviewed the CT imaging today in the office.  We discussed the possibilities of need for follow-up versus being related to the patient's LEXNT-70 in January.  We also looked at similar images of COVID-19 on CT imaging to help give him some comparison of what peripheral groundglass opacities would look like in this setting.  He was somewhat initially anxious.  Of note he is a former smoker he smoked for 15 years at max 1 pack/day.  Smoked as a teenager and quit about 3 years ago.  Patient works as a Chief Operating Officer downtown.   Past Medical History:  Diagnosis Date  . Anxiety      Family History  Problem Relation Age of Onset  . Healthy Mother   . Healthy Father      History reviewed. No pertinent surgical history.  Social History   Socioeconomic History  . Marital status: Married    Spouse name: Not on file  . Number of children: Not on file  . Years of education: Not on file  . Highest education  level: Not on file  Occupational History  . Not on file  Tobacco Use  . Smoking status: Former Smoker    Types: Cigarettes    Quit date: 03/10/2016    Years since quitting: 3.0  . Smokeless tobacco: Never Used  Substance and Sexual Activity  . Alcohol use: Never  . Drug use: Never  . Sexual activity: Not on file  Other Topics Concern  . Not on file  Social History Narrative  . Not on file   Social Determinants of Health   Financial Resource Strain:   . Difficulty of Paying Living Expenses: Not on file  Food Insecurity:   . Worried About Charity fundraiser in the Last Year: Not on file  . Ran Out of Food in the Last Year: Not on file  Transportation Needs:   . Lack of Transportation (Medical): Not on file  . Lack of Transportation (Non-Medical): Not on file  Physical Activity:   . Days of Exercise per Week: Not on file  . Minutes of Exercise per Session: Not on file  Stress:   . Feeling of Stress : Not on file  Social Connections:   . Frequency of Communication with Friends and Family: Not on file  . Frequency of Social Gatherings with Friends and Family: Not on file  . Attends Religious Services: Not on file  .  Active Member of Clubs or Organizations: Not on file  . Attends Banker Meetings: Not on file  . Marital Status: Not on file  Intimate Partner Violence:   . Fear of Current or Ex-Partner: Not on file  . Emotionally Abused: Not on file  . Physically Abused: Not on file  . Sexually Abused: Not on file     No Known Allergies   Outpatient Medications Prior to Visit  Medication Sig Dispense Refill  . acetaminophen (TYLENOL) 325 MG tablet Take 650 mg by mouth every 6 (six) hours as needed for mild pain or headache.    Marland Kitchen FLUoxetine (PROZAC) 40 MG capsule Take 40 mg by mouth daily.  11  . azithromycin (ZITHROMAX) 250 MG tablet Take 1 tablet (250 mg total) by mouth daily. Take first 2 tablets together, then 1 every day until finished. 6 tablet 0   No  facility-administered medications prior to visit.    Review of Systems  Constitutional: Negative for chills, fever, malaise/fatigue and weight loss.  HENT: Negative for hearing loss, sore throat and tinnitus.   Eyes: Negative for blurred vision and double vision.  Respiratory: Negative for cough, hemoptysis, sputum production, shortness of breath, wheezing and stridor.   Cardiovascular: Negative for chest pain, palpitations, orthopnea, leg swelling and PND.  Gastrointestinal: Negative for abdominal pain, constipation, diarrhea, heartburn, nausea and vomiting.  Genitourinary: Negative for dysuria, hematuria and urgency.  Musculoskeletal: Negative for joint pain and myalgias.  Skin: Negative for itching and rash.  Neurological: Negative for dizziness, tingling, weakness and headaches.  Endo/Heme/Allergies: Negative for environmental allergies. Does not bruise/bleed easily.  Psychiatric/Behavioral: Negative for depression. The patient is not nervous/anxious and does not have insomnia.   All other systems reviewed and are negative.    Objective:  Physical Exam Vitals reviewed.  Constitutional:      General: He is not in acute distress.    Appearance: He is well-developed.  HENT:     Head: Normocephalic and atraumatic.  Eyes:     General: No scleral icterus.    Conjunctiva/sclera: Conjunctivae normal.     Pupils: Pupils are equal, round, and reactive to light.  Neck:     Vascular: No JVD.     Trachea: No tracheal deviation.  Cardiovascular:     Rate and Rhythm: Normal rate and regular rhythm.     Heart sounds: Normal heart sounds. No murmur.  Pulmonary:     Effort: Pulmonary effort is normal. No tachypnea, accessory muscle usage or respiratory distress.     Breath sounds: Normal breath sounds. No stridor. No wheezing, rhonchi or rales.  Musculoskeletal:        General: No tenderness.     Cervical back: Neck supple.  Lymphadenopathy:     Cervical: No cervical adenopathy.    Skin:    General: Skin is warm and dry.     Capillary Refill: Capillary refill takes less than 2 seconds.     Findings: No rash.  Neurological:     Mental Status: He is alert and oriented to person, place, and time.  Psychiatric:        Behavior: Behavior normal.      Vitals:   03/11/19 0859  BP: 134/70  Pulse: 64  Temp: (!) 97.5 F (36.4 C)  TempSrc: Temporal  SpO2: 99%  Weight: 205 lb (93 kg)  Height: 6\' 3"  (1.905 m)   99% on RA BMI Readings from Last 3 Encounters:  03/11/19 25.62 kg/m  02/28/19 26.71 kg/m  02/20/19 26.71 kg/m   Wt Readings from Last 3 Encounters:  03/11/19 205 lb (93 kg)  02/28/19 208 lb (94.3 kg)  02/20/19 208 lb (94.3 kg)     CBC    Component Value Date/Time   WBC 5.5 02/20/2019 0833   RBC 5.12 02/20/2019 0833   HGB 16.2 02/20/2019 0833   HCT 46.1 02/20/2019 0833   PLT 253 02/20/2019 0833   MCV 90.0 02/20/2019 0833   MCH 31.6 02/20/2019 0833   MCHC 35.1 02/20/2019 0833   RDW 12.3 02/20/2019 0833    Chest Imaging: CT chest 02/20/2019: Small focal peripheral opacity within the left upper lobe. The patient's images have been independently reviewed by me.    Pulmonary Functions Testing Results: No flowsheet data found.     Assessment & Plan:     ICD-10-CM   1. History of 2019 novel coronavirus disease (COVID-19)  Z86.16   2. Ground glass opacity present on imaging of lung  R91.8   3. 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  Had imaging in the ER with a left upper lobe peripheral posterior groundglass opacity.  This was approximately 4 weeks out from his COVID-19 symptoms.  He also found to have a calcified granuloma in the right medial lung base.  We reviewed the patient's images in the office.  We discussed the fact that the calcified granuloma is benign.  We also discussed the risk benefits and alternatives of proceeding with ongoing  imaging of the groundglass opacity.  I feel as if this is likely related to his COVID-19.  And due to how small this lesion is and a known causation would not recommend any additional follow-up at his current age.  Plan Following Extensive Data Review & Interpretation:  . I reviewed prior external note(s) from emergency department visit 02/28/2019 Dr. Effie Shy, 02/20/2019 ED visit Dr. Criss Alvine. . I reviewed the result(s) of CBC unremarkable, CMP unremarkable 02/20/2019  I did recommend if the patient felt very anxious regarding this imaging and does decide on his own that he would like follow-up imaging in 6 months then I would gladly order repeat CT imaging for 18-month follow-up to ensure resolution however, due to cost of repeating imaging and diagnostic yield I believe that it is also appropriate to not have any additional imaging of this groundglass opacity.  Patient is agreeable to this plan.  Independent interpretation of tests . Review of patient's CT chest 02/20/2019 images revealed small focal rounded opacity in the posterior segment of the left upper lobe. The patient's images have been independently reviewed by me.     Current Outpatient Medications:  .  acetaminophen (TYLENOL) 325 MG tablet, Take 650 mg by mouth every 6 (six) hours as needed for mild pain or headache., Disp: , Rfl:  .  FLUoxetine (PROZAC) 40 MG capsule, Take 40 mg by mouth daily., Disp: , Rfl: 11   Josephine Igo, DO [facility] 03/11/2019 9:16 AM

## 2019-03-11 NOTE — Patient Instructions (Addendum)
Thank you for visiting Dr. Aleta Manternach at [facility]. Today we recommend the following:  Return if symptoms worsen or fail to improve.    Please do your part to reduce the spread of COVID-19.  

## 2019-07-27 DIAGNOSIS — D2261 Melanocytic nevi of right upper limb, including shoulder: Secondary | ICD-10-CM | POA: Diagnosis not present

## 2019-09-29 DIAGNOSIS — Z1283 Encounter for screening for malignant neoplasm of skin: Secondary | ICD-10-CM | POA: Diagnosis not present

## 2019-09-29 DIAGNOSIS — D225 Melanocytic nevi of trunk: Secondary | ICD-10-CM | POA: Diagnosis not present

## 2019-10-01 DIAGNOSIS — L578 Other skin changes due to chronic exposure to nonionizing radiation: Secondary | ICD-10-CM | POA: Diagnosis not present

## 2019-10-01 DIAGNOSIS — L910 Hypertrophic scar: Secondary | ICD-10-CM | POA: Diagnosis not present

## 2019-10-01 DIAGNOSIS — D225 Melanocytic nevi of trunk: Secondary | ICD-10-CM | POA: Diagnosis not present

## 2019-10-01 DIAGNOSIS — L703 Acne tropica: Secondary | ICD-10-CM | POA: Diagnosis not present

## 2019-10-13 DIAGNOSIS — F419 Anxiety disorder, unspecified: Secondary | ICD-10-CM | POA: Diagnosis not present

## 2019-12-11 DIAGNOSIS — Z113 Encounter for screening for infections with a predominantly sexual mode of transmission: Secondary | ICD-10-CM | POA: Diagnosis not present

## 2019-12-11 DIAGNOSIS — Z114 Encounter for screening for human immunodeficiency virus [HIV]: Secondary | ICD-10-CM | POA: Diagnosis not present

## 2020-01-12 DIAGNOSIS — F419 Anxiety disorder, unspecified: Secondary | ICD-10-CM | POA: Diagnosis not present

## 2020-03-03 DIAGNOSIS — D485 Neoplasm of uncertain behavior of skin: Secondary | ICD-10-CM | POA: Diagnosis not present

## 2020-03-10 DIAGNOSIS — D045 Carcinoma in situ of skin of trunk: Secondary | ICD-10-CM | POA: Diagnosis not present

## 2020-03-10 DIAGNOSIS — L900 Lichen sclerosus et atrophicus: Secondary | ICD-10-CM | POA: Diagnosis not present

## 2020-03-29 DIAGNOSIS — F419 Anxiety disorder, unspecified: Secondary | ICD-10-CM | POA: Diagnosis not present

## 2020-05-11 IMAGING — CT CT NECK W/ CM
4 of 5 series · 15 of 33 positions shown, 17 images · IV contrast (iopamidol)
Comparison: Cervical spine radiographs 01/22/2010

CLINICAL DATA: Right-sided neck pain for 1 week.  Stridor.

EXAM:
CT NECK WITH CONTRAST
TECHNIQUE: Multidetector CT imaging of the neck was performed using the
standard protocol following the bolus administration of intravenous
contrast.
CONTRAST:  75mL [id number] IOPAMIDOL ([id number]) INJECTION 61%

[Series 2: axial neck · axial · 0.39mm/px · z∈[+1126,+1224]mm · 3 of 123 slices shown]
[im 25/123  bone]
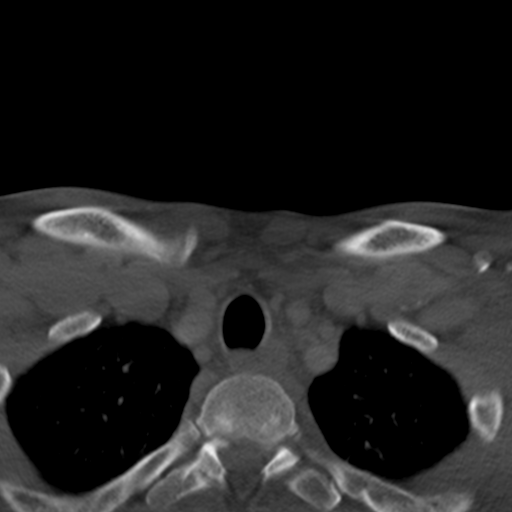
[im 49/123  bone]
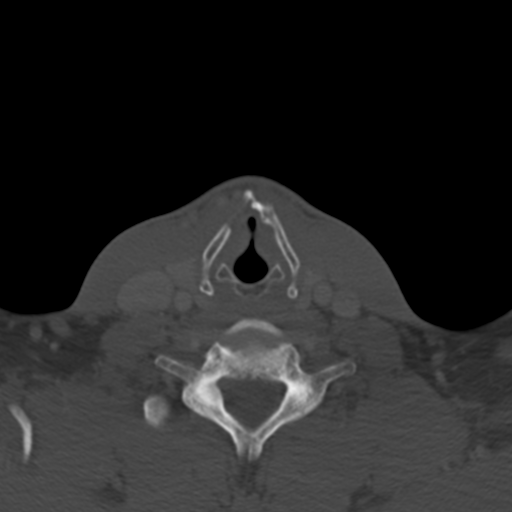
[im 74/123  bone]
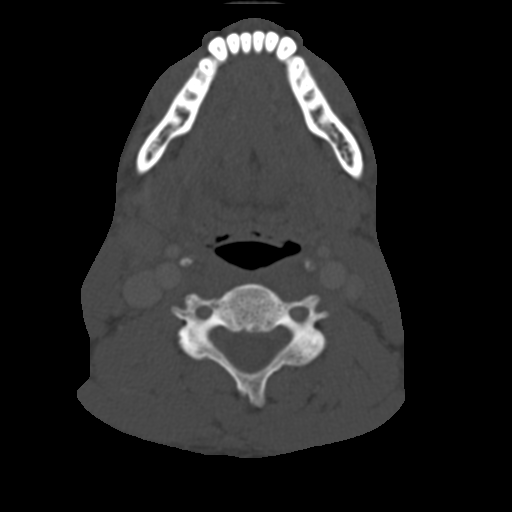

[Series 4: orthogonal ax · axial · 0.39mm/px · z∈[+1094,+1249]mm · 4 of 134 slices shown, 5 images]
[im 27/134  soft-tissue]
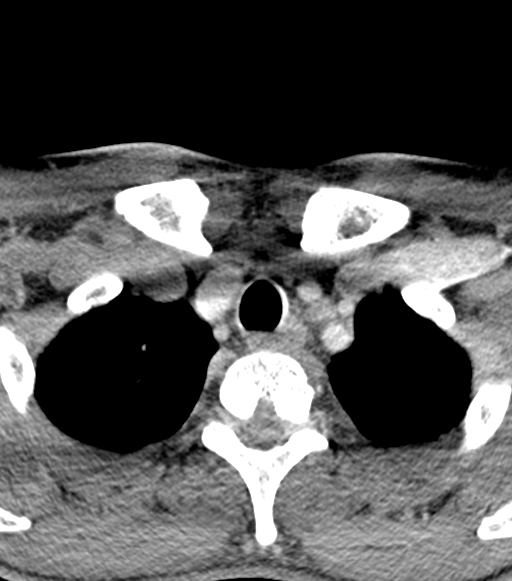
[im 27/134  bone]
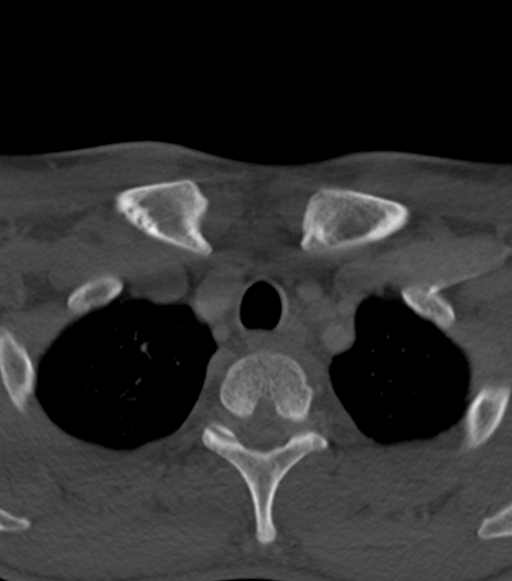
[im 54/134  bone]
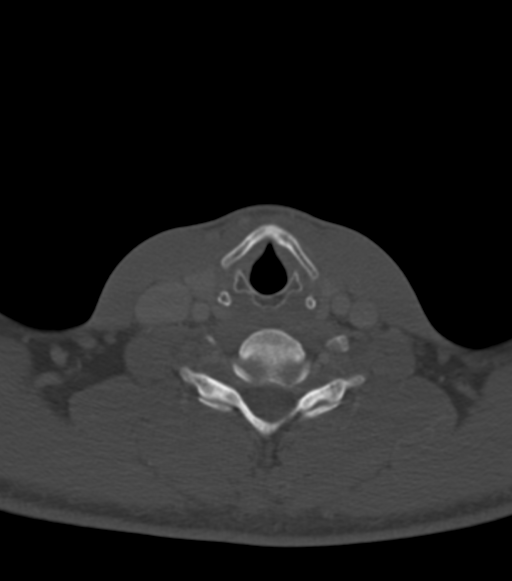
[im 80/134  bone]
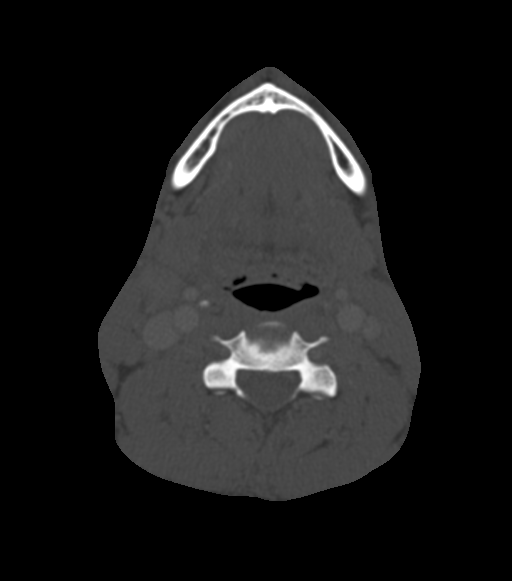
[im 107/134  bone]
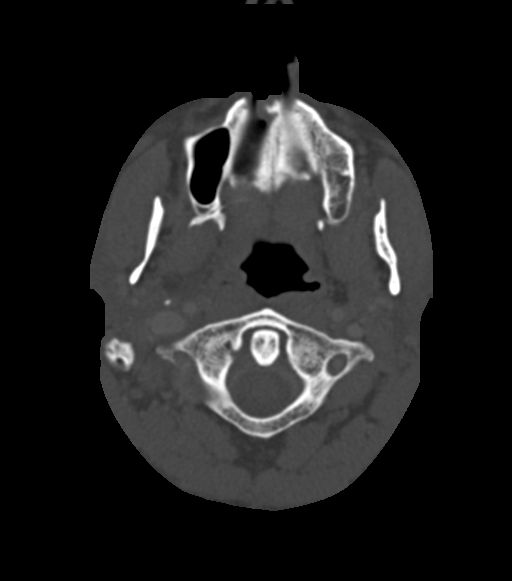

[Series 5: cor neck · coronal · 0.43mm/px · 3 of 109 slices shown]
[im 29/109  bone]
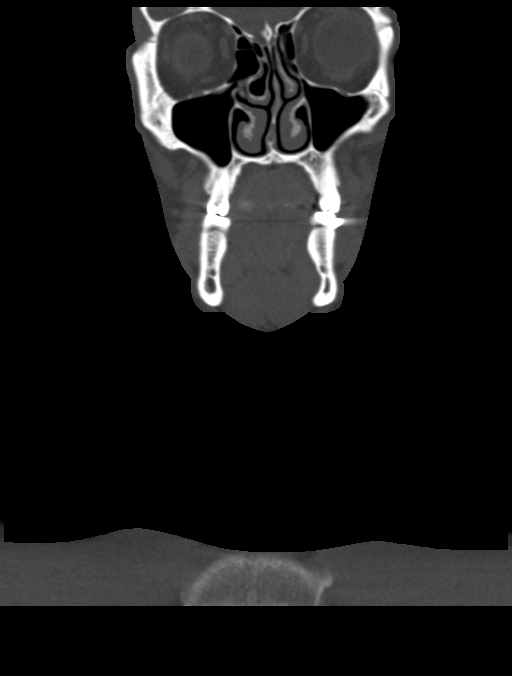
[im 46/109  bone]
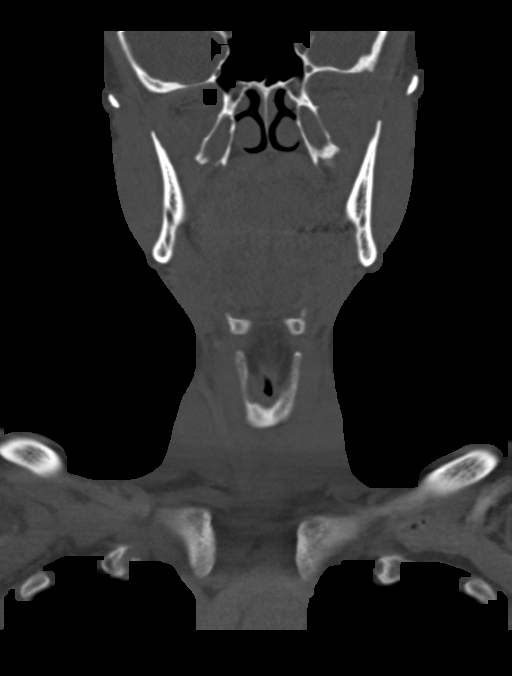
[im 63/109  bone]
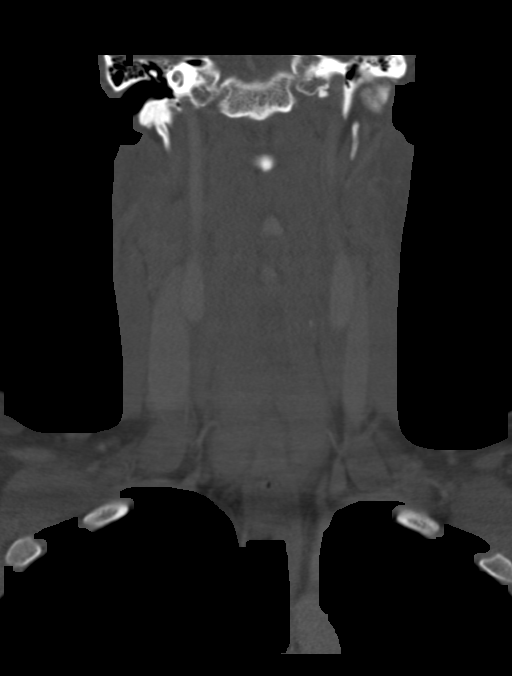

[Series 6: sag neck · sagittal · 0.48mm/px · 5 of 101 slices shown, 6 images]
[im 34/101  bone]
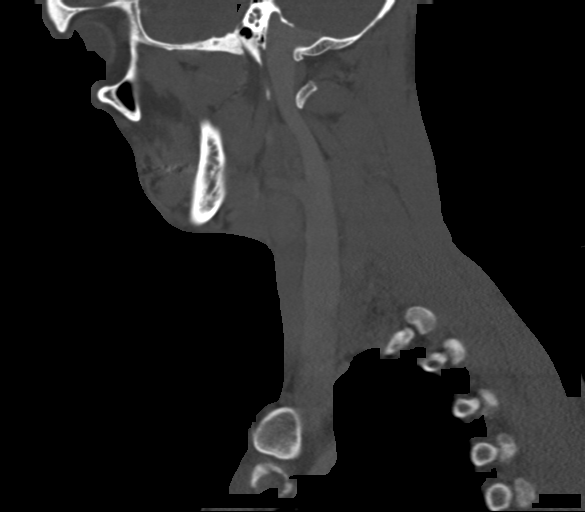
[im 42/101  bone]
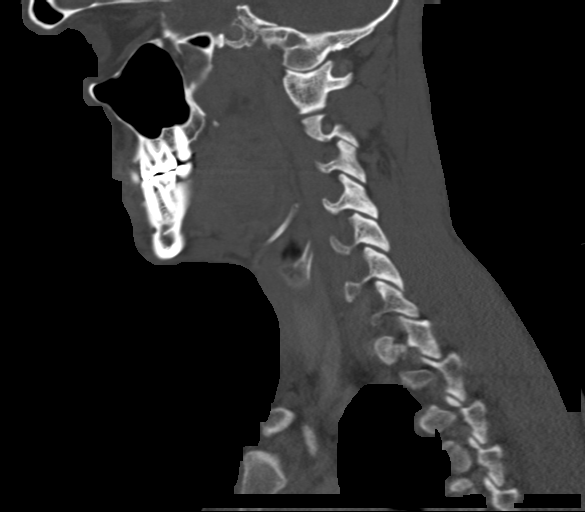
[im 51/101  soft-tissue]
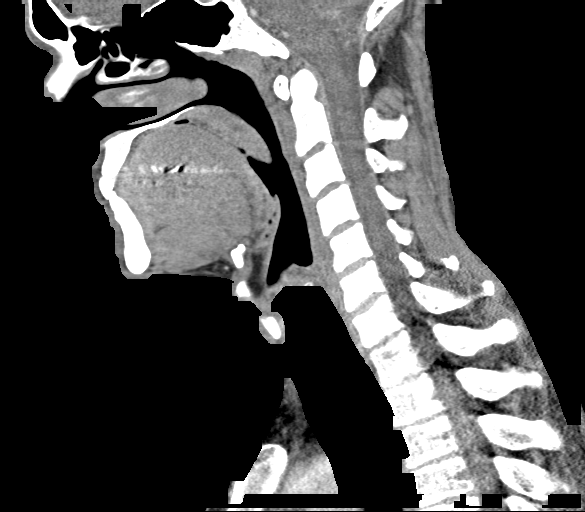
[im 51/101  bone]
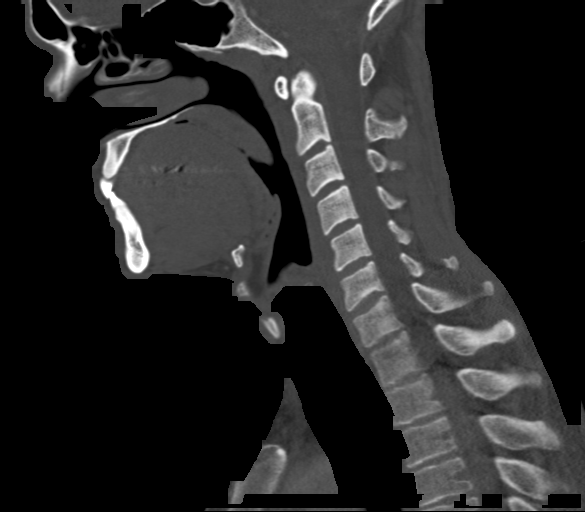
[im 59/101  bone]
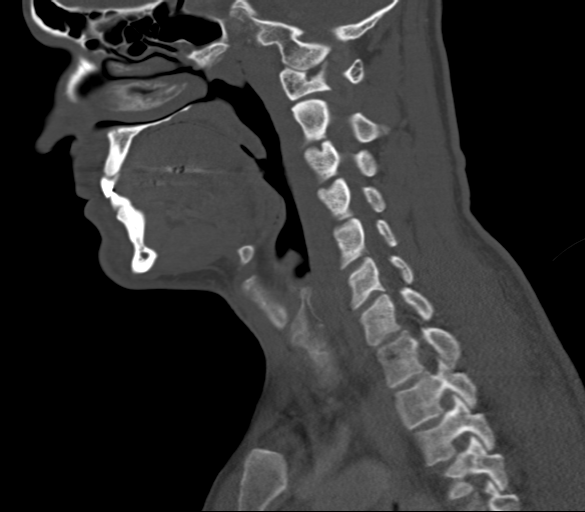
[im 67/101  bone]
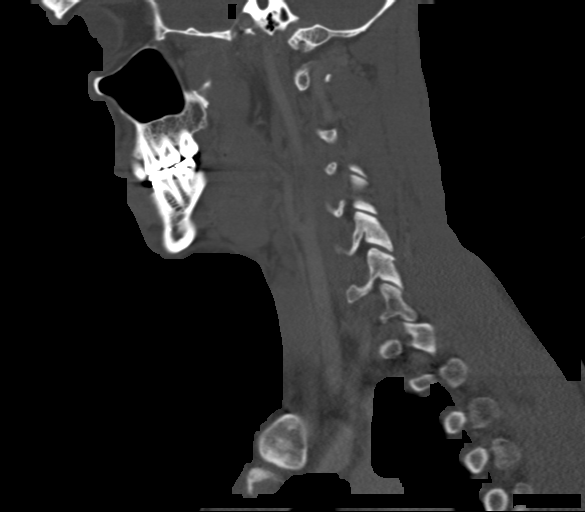

[15 of 33 positions shown; findings below may reference images not displayed]

FINDINGS: Pharynx and larynx: The right palatine tonsil is enlarged and
heterogeneous. A discrete abscess is not present. There is diffuse
stranding in the adjacent parapharyngeal fat. No focal mass lesion
is present. The airway is patent. There is mild prominence of the
left palatine tonsil and lingual tonsils. No focal mass lesion is
present. The hypopharynx is within normal limits. Epiglottis is
normal. Vocal cords are midline and symmetric. The trachea is within
normal limits.

Salivary glands: The parotid and submandibular glands are normal
bilaterally.

Thyroid: Choose 1

Lymph nodes: Prominent right bilateral enlarged level 2 lymph nodes
are than left more. There stranding about the nodes on the right
compatible with acute inflammation.

Vascular: Negative

Limited intracranial: Within normal limits.

Visualized orbits: Visualized globes and orbits are unremarkable.

Mastoids and visualized paranasal sinuses: A polyp or mucous
retention cyst is noted anteriorly in the right sphenoid sinus. The
paranasal sinuses and mastoid air cells are otherwise clear.

Skeleton: Vertebral body heights and alignment are maintained. No
focal lytic or blastic lesions are present.

Upper chest: The lung apices are clear.  Thoracic inlet is normal.
IMPRESSION: 1. Enlarged heterogeneously enhancing right palatine tonsil
compatible with acute tonsillitis/pharyngitis.
2. No discrete drainable abscess.
3. Reactive level 2 adenopathy, right greater than left.

## 2020-06-30 DIAGNOSIS — D045 Carcinoma in situ of skin of trunk: Secondary | ICD-10-CM | POA: Diagnosis not present

## 2020-06-30 DIAGNOSIS — L910 Hypertrophic scar: Secondary | ICD-10-CM | POA: Diagnosis not present

## 2020-09-05 DIAGNOSIS — S90862A Insect bite (nonvenomous), left foot, initial encounter: Secondary | ICD-10-CM | POA: Diagnosis not present

## 2020-09-05 DIAGNOSIS — W57XXXA Bitten or stung by nonvenomous insect and other nonvenomous arthropods, initial encounter: Secondary | ICD-10-CM | POA: Diagnosis not present

## 2020-09-05 DIAGNOSIS — S90861A Insect bite (nonvenomous), right foot, initial encounter: Secondary | ICD-10-CM | POA: Diagnosis not present

## 2020-09-21 DIAGNOSIS — K645 Perianal venous thrombosis: Secondary | ICD-10-CM | POA: Diagnosis not present

## 2021-03-15 DIAGNOSIS — F411 Generalized anxiety disorder: Secondary | ICD-10-CM | POA: Diagnosis not present

## 2021-03-15 DIAGNOSIS — R030 Elevated blood-pressure reading, without diagnosis of hypertension: Secondary | ICD-10-CM | POA: Diagnosis not present

## 2021-03-30 DIAGNOSIS — Z131 Encounter for screening for diabetes mellitus: Secondary | ICD-10-CM | POA: Diagnosis not present

## 2021-03-30 DIAGNOSIS — Z0000 Encounter for general adult medical examination without abnormal findings: Secondary | ICD-10-CM | POA: Diagnosis not present

## 2021-03-30 DIAGNOSIS — E781 Pure hyperglyceridemia: Secondary | ICD-10-CM | POA: Diagnosis not present

## 2021-09-29 DIAGNOSIS — F411 Generalized anxiety disorder: Secondary | ICD-10-CM | POA: Diagnosis not present

## 2021-10-05 DIAGNOSIS — L02414 Cutaneous abscess of left upper limb: Secondary | ICD-10-CM | POA: Diagnosis not present

## 2021-10-05 DIAGNOSIS — L02219 Cutaneous abscess of trunk, unspecified: Secondary | ICD-10-CM | POA: Diagnosis not present

## 2021-10-05 DIAGNOSIS — L0100 Impetigo, unspecified: Secondary | ICD-10-CM | POA: Diagnosis not present

## 2022-01-03 DIAGNOSIS — J069 Acute upper respiratory infection, unspecified: Secondary | ICD-10-CM | POA: Diagnosis not present

## 2022-01-03 DIAGNOSIS — R6889 Other general symptoms and signs: Secondary | ICD-10-CM | POA: Diagnosis not present

## 2022-01-03 DIAGNOSIS — Z1152 Encounter for screening for COVID-19: Secondary | ICD-10-CM | POA: Diagnosis not present

## 2022-01-03 DIAGNOSIS — R197 Diarrhea, unspecified: Secondary | ICD-10-CM | POA: Diagnosis not present

## 2022-04-16 DIAGNOSIS — Z0000 Encounter for general adult medical examination without abnormal findings: Secondary | ICD-10-CM | POA: Diagnosis not present

## 2022-04-16 DIAGNOSIS — Z13220 Encounter for screening for lipoid disorders: Secondary | ICD-10-CM | POA: Diagnosis not present

## 2022-07-03 DIAGNOSIS — D225 Melanocytic nevi of trunk: Secondary | ICD-10-CM | POA: Diagnosis not present

## 2022-07-03 DIAGNOSIS — L814 Other melanin hyperpigmentation: Secondary | ICD-10-CM | POA: Diagnosis not present

## 2022-07-03 DIAGNOSIS — L578 Other skin changes due to chronic exposure to nonionizing radiation: Secondary | ICD-10-CM | POA: Diagnosis not present

## 2023-05-07 DIAGNOSIS — Z13220 Encounter for screening for lipoid disorders: Secondary | ICD-10-CM | POA: Diagnosis not present

## 2023-05-07 DIAGNOSIS — Z6826 Body mass index (BMI) 26.0-26.9, adult: Secondary | ICD-10-CM | POA: Diagnosis not present

## 2023-05-07 DIAGNOSIS — Z0000 Encounter for general adult medical examination without abnormal findings: Secondary | ICD-10-CM | POA: Diagnosis not present
